# Patient Record
Sex: Female | Born: 1955 | Race: White | Hispanic: No | Marital: Married | Smoking: Former smoker
Health system: Southern US, Community
[De-identification: ages and names within clinical notes are randomized; demographics above are authoritative.]

## PROBLEM LIST (undated history)

## (undated) DIAGNOSIS — E669 Obesity, unspecified: Secondary | ICD-10-CM

## (undated) DIAGNOSIS — E119 Type 2 diabetes mellitus without complications: Secondary | ICD-10-CM

---

## 2004-09-09 ENCOUNTER — Ambulatory Visit: Payer: Self-pay

## 2006-03-15 ENCOUNTER — Ambulatory Visit: Payer: Self-pay | Admitting: Internal Medicine

## 2006-04-07 ENCOUNTER — Ambulatory Visit: Payer: Self-pay | Admitting: Internal Medicine

## 2006-10-03 ENCOUNTER — Ambulatory Visit: Payer: Self-pay | Admitting: Internal Medicine

## 2007-04-12 ENCOUNTER — Ambulatory Visit: Payer: Self-pay | Admitting: Internal Medicine

## 2007-05-29 ENCOUNTER — Ambulatory Visit: Payer: Self-pay | Admitting: Internal Medicine

## 2007-06-11 IMAGING — US ULTRASOUND LEFT BREAST
1 series · 17 of 22 positions shown · non-contrast
Comparison: none

REASON FOR EXAM: Nodule
COMMENTS:

[Series 1: ultrasound left breast · 17 of 22 slices shown]
[im 1/22]
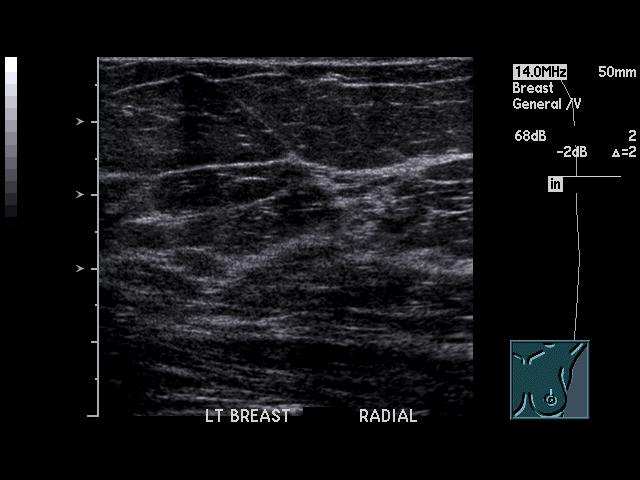
[im 2/22]
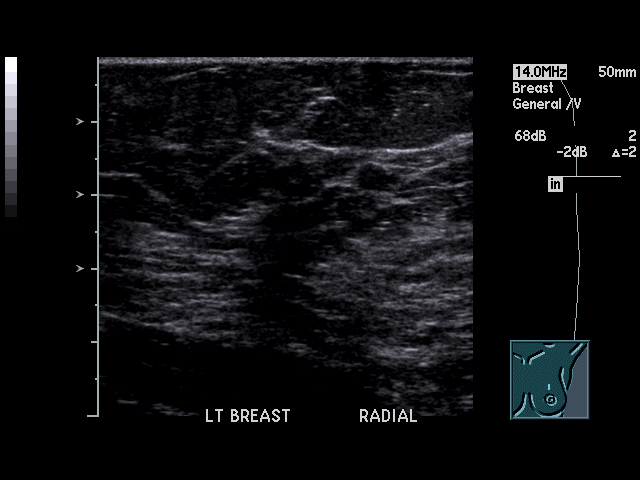
[im 4/22]
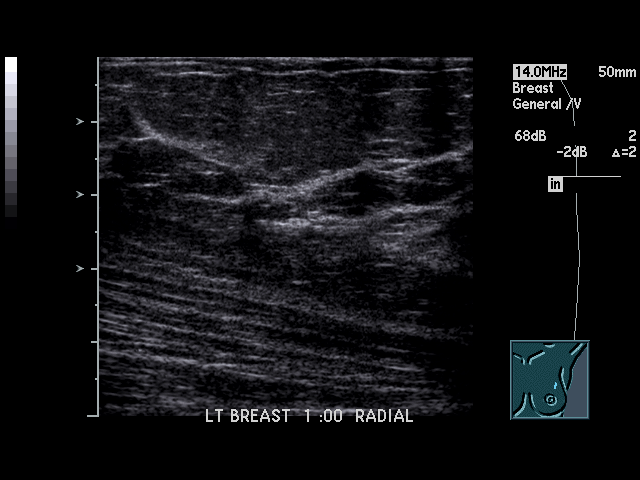
[im 5/22]
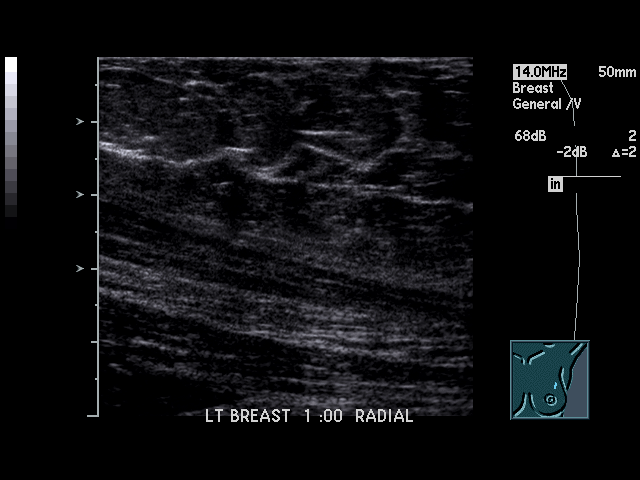
[im 6/22]
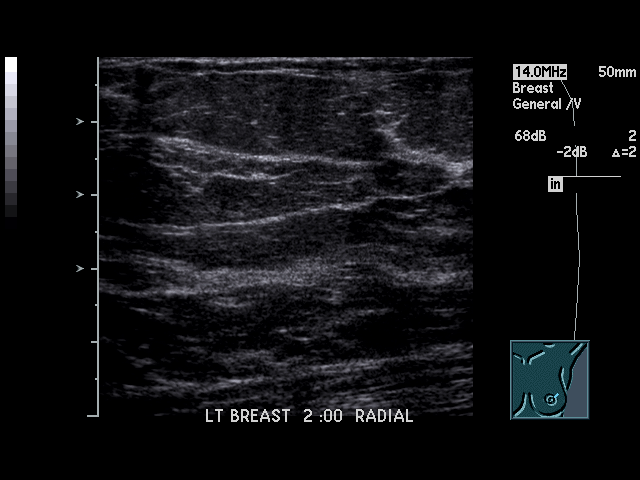
[im 8/22]
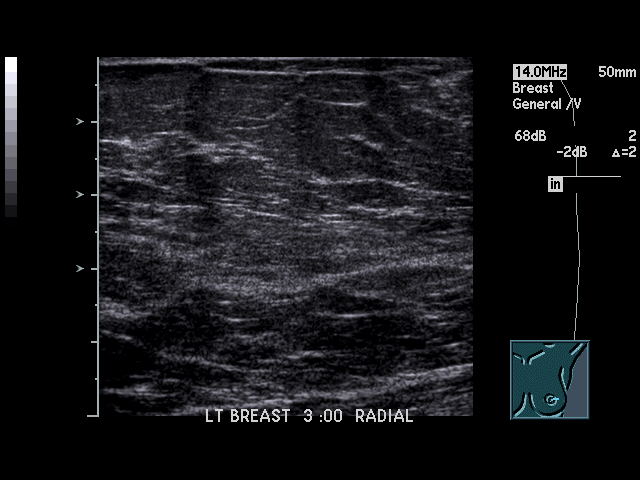
[im 9/22]
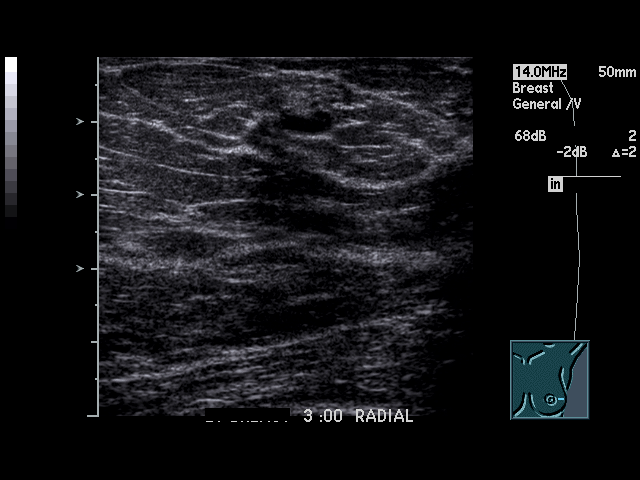
[im 10/22]
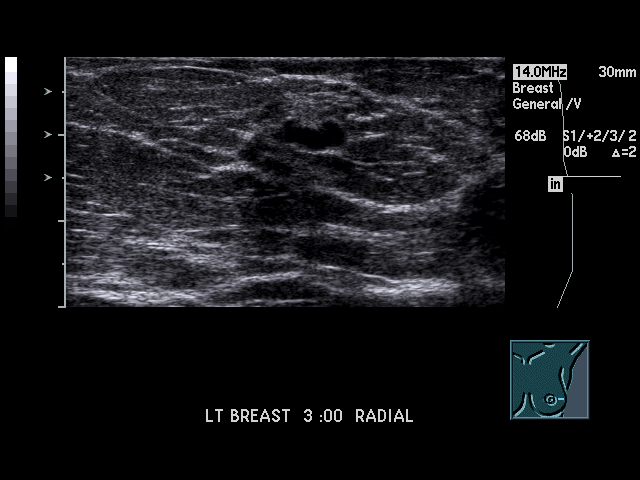
[im 12/22]
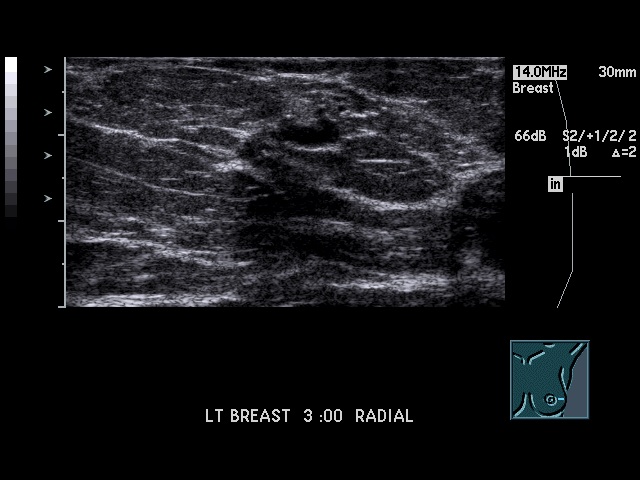
[im 13/22]
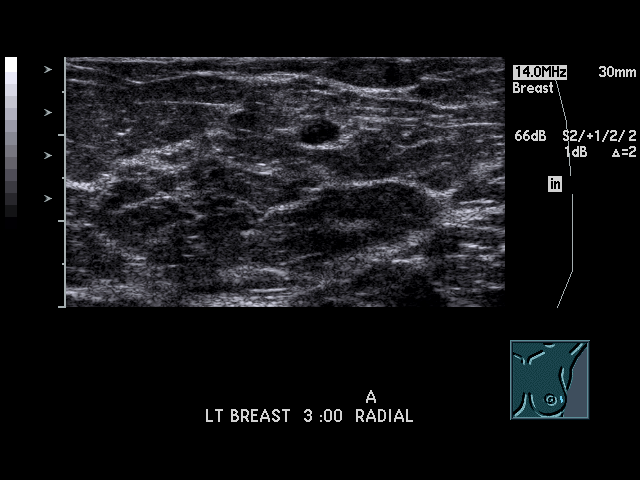
[im 14/22]
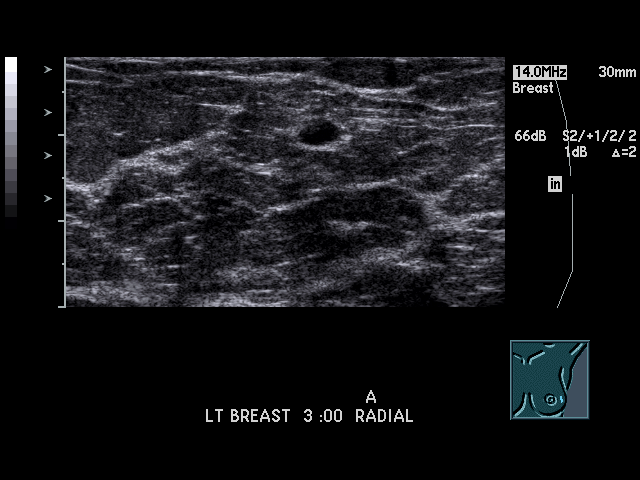
[im 15/22]
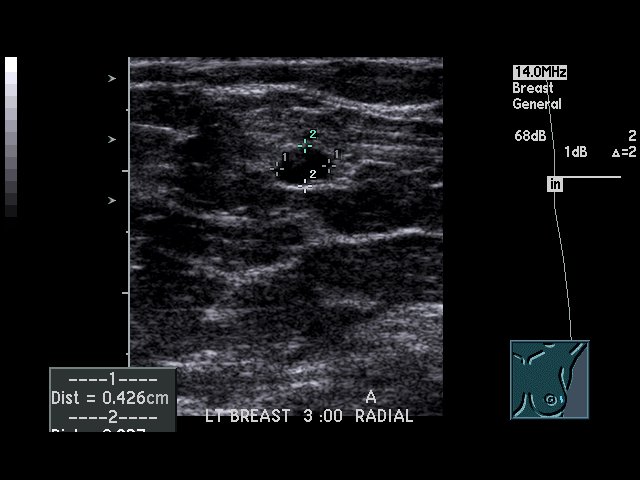
[im 17/22]
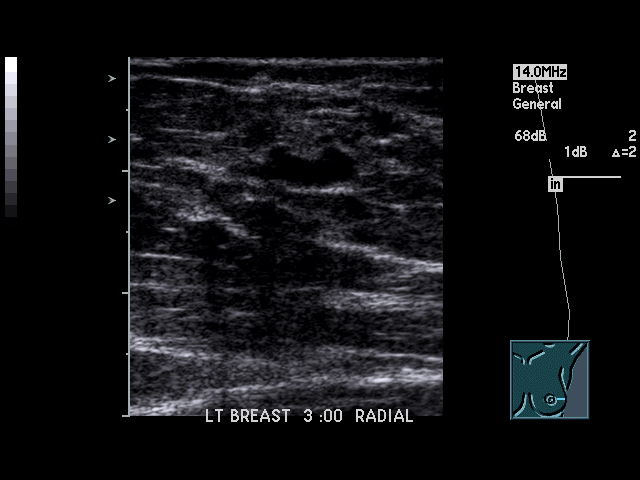
[im 18/22]
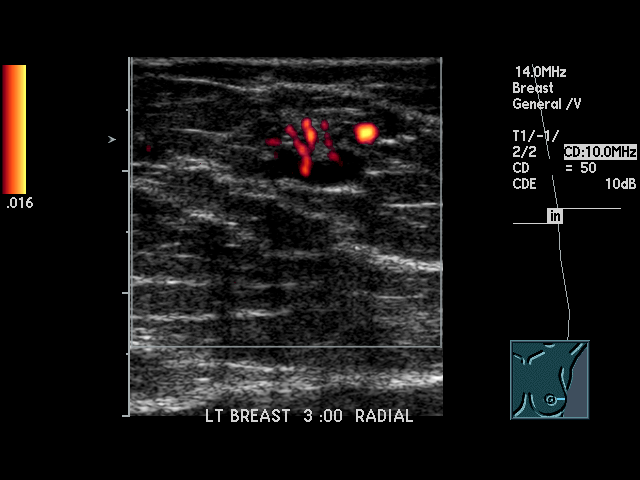
[im 19/22]
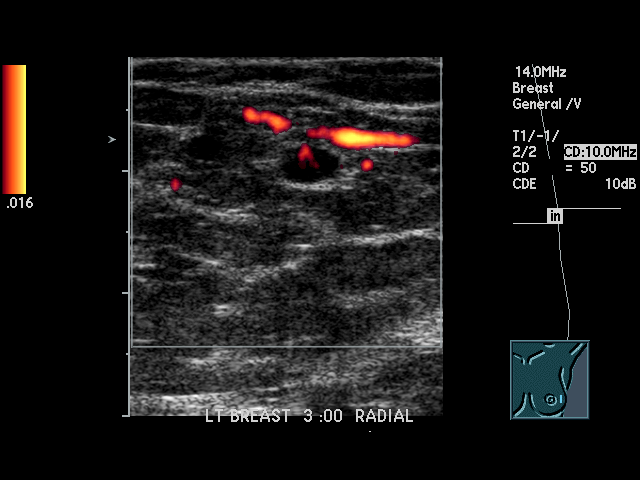
[im 21/22]
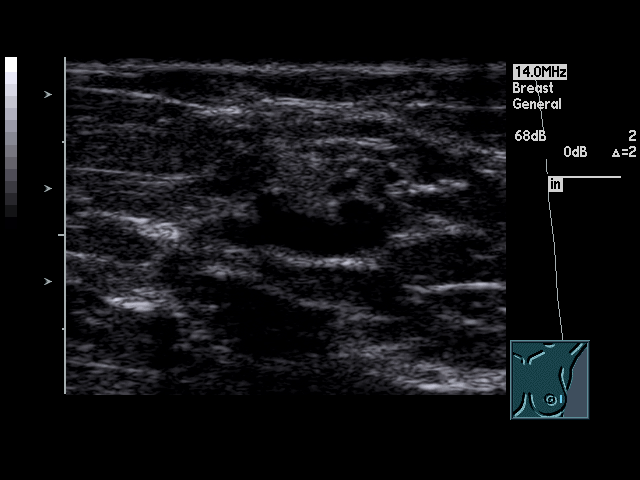
[im 22/22]
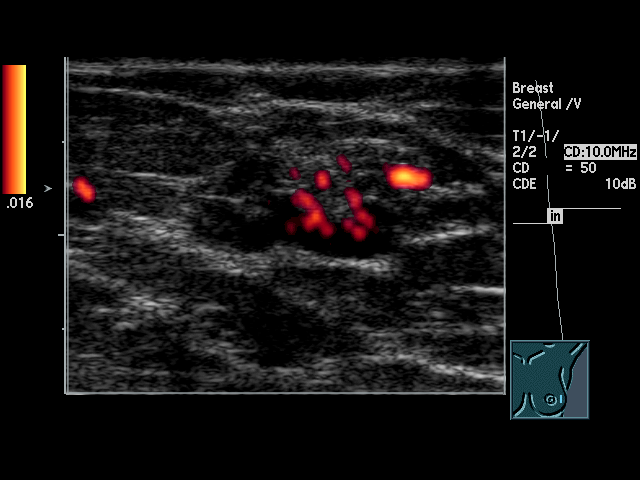

[17 of 22 positions shown; findings below may reference images not displayed]

PROCEDURE:     US  - US BREAST LEFT  - April 07, 2006  [DATE]

RESULT:     A reniform, hypoechoic nodule with no through transmission but
increased echogenicity in the central portion of the nodule is noted in the
upper outer aspect of the LEFT breast measuring approximately 6.0 to 7.0 mm.
This is consistent with a benign, intramammary lymph node. Follow-up LEFT
breast mammogram to demonstrate stability is suggested in six months.
IMPRESSION: Findings most consistent with a benign, intramammary lymph
node as described above.

BI-RADS: Category 3 - Probably Benign Finding (Interval follow-up).

## 2008-06-17 ENCOUNTER — Ambulatory Visit: Payer: Self-pay | Admitting: Internal Medicine

## 2008-07-10 ENCOUNTER — Ambulatory Visit: Payer: Self-pay | Admitting: Internal Medicine

## 2009-01-10 ENCOUNTER — Ambulatory Visit: Payer: Self-pay

## 2009-08-27 ENCOUNTER — Ambulatory Visit: Payer: Self-pay | Admitting: Internal Medicine

## 2010-09-22 ENCOUNTER — Ambulatory Visit: Payer: Self-pay

## 2018-03-12 ENCOUNTER — Emergency Department: Payer: 59

## 2018-03-12 ENCOUNTER — Emergency Department
Admission: EM | Admit: 2018-03-12 | Discharge: 2018-03-12 | Disposition: A | Discharge status: Home / Self Care | Payer: 59 | Attending: Emergency Medicine | Admitting: Emergency Medicine

## 2018-03-12 ENCOUNTER — Other Ambulatory Visit: Payer: Self-pay

## 2018-03-12 DIAGNOSIS — E119 Type 2 diabetes mellitus without complications: Secondary | ICD-10-CM | POA: Insufficient documentation

## 2018-03-12 DIAGNOSIS — H5319 Other subjective visual disturbances: Secondary | ICD-10-CM | POA: Diagnosis not present

## 2018-03-12 DIAGNOSIS — R51 Headache: Secondary | ICD-10-CM | POA: Diagnosis present

## 2018-03-12 DIAGNOSIS — Z87891 Personal history of nicotine dependence: Secondary | ICD-10-CM | POA: Diagnosis not present

## 2018-03-12 HISTORY — DX: Type 2 diabetes mellitus without complications: E11.9

## 2018-03-12 HISTORY — DX: Obesity, unspecified: E66.9

## 2018-03-12 LAB — CBC
HCT: 43.6 % (ref 36.0–46.0)
Hemoglobin: 13.6 g/dL (ref 12.0–15.0)
MCH: 28.9 pg (ref 26.0–34.0)
MCHC: 31.2 g/dL (ref 30.0–36.0)
MCV: 92.6 fL (ref 80.0–100.0)
Platelets: 311 10*3/uL (ref 150–400)
RBC: 4.71 MIL/uL (ref 3.87–5.11)
RDW: 12.8 % (ref 11.5–15.5)
WBC: 6.9 10*3/uL (ref 4.0–10.5)
nRBC: 0 % (ref 0.0–0.2)

## 2018-03-12 LAB — BASIC METABOLIC PANEL
Anion gap: 8 (ref 5–15)
BUN: 18 mg/dL (ref 8–23)
CO2: 27 mmol/L (ref 22–32)
Calcium: 9.2 mg/dL (ref 8.9–10.3)
Chloride: 106 mmol/L (ref 98–111)
Creatinine, Ser: 0.78 mg/dL (ref 0.44–1.00)
GFR calc non Af Amer: >60 mL/min (ref >60)
Glucose, Bld: 121 mg/dL — ABNORMAL HIGH (ref 70–99)
Potassium: 4.1 mmol/L (ref 3.5–5.1)
Sodium: 141 mmol/L (ref 135–145)

## 2018-03-12 NOTE — ED Notes (Signed)
Signature pad not working unable to obtain signature for discharge. Hard copy printed and signed by pt.

## 2018-03-12 NOTE — ED Triage Notes (Signed)
Pt states yesterday starting "seeing lightning bolts off to the side" on R side. States HA and pressure in beck of neck. Symptoms began "right after I woke up but it's not continuous." took 2 excedrin. Denies blood thinner use.   A&O, ambulatory. Speaking in clear complete sentences. Moving all extremities. No drift/droop noted.

## 2018-03-12 NOTE — Discharge Instructions (Signed)
Your labs and CT scan of the head today were unremarkable.  Please follow-up with ophthalmology for further assessment of your visual symptoms.

## 2018-03-12 NOTE — ED Notes (Signed)
Pt verbalized understanding of discharge instructions. NAD at this time. 

## 2018-03-12 NOTE — ED Provider Notes (Signed)
[facility] Emergency Department Provider Note  ____________________________________________  Time seen: Approximately 5:51 PM  I have reviewed the triage vital signs and the nursing notes.   HISTORY  Chief Complaint Headache and Eye Problem    HPI Morgan Huynh is a 62 y.o. female who reports yesterday morning having intermittent episodes of seeing a flash of light off in her right peripheral vision.  No specific inciting activities such as looking in one direction or turning her neck.  No thunderclap headache or sudden severe pain in the head or neck.  Symptoms have been intermittent and have been completely resolved for the past 6 hours.  Currently feels back to normal.  She also reports some right occipital pain and right neck pain which is typical of her usual migraine syndrome.  Overall she does feel like her symptoms are consistent with her usual migraines, other than the flash of light in her peripheral vision.  Denies any extremity paresthesia or weakness, no recent trauma.      Past Medical History:  Diagnosis Date  . Diabetes mellitus without complication (HCC)   . Obesity      There are no active problems to display for this patient.    History reviewed. No pertinent surgical history.   Prior to Admission medications   Not on File     Allergies Patient has no known allergies.   History reviewed. No pertinent family history.  Social History Social History   Tobacco Use  . Smoking status: Former Smoker  Substance Use Topics  . Alcohol use: Yes  . Drug use: Not on file    Review of Systems  Constitutional:   No fever or chills.  ENT:   No sore throat. No rhinorrhea. Cardiovascular:   No chest pain or syncope. Respiratory:   No dyspnea or cough. Gastrointestinal:   Negative for abdominal pain, vomiting and diarrhea.  Musculoskeletal:   Negative for focal pain or swelling All other systems reviewed and are negative except  as documented above in ROS and HPI.  ____________________________________________   PHYSICAL EXAM:  VITAL SIGNS: ED Triage Vitals  Enc Vitals Group     BP 03/12/18 1347 (!) 99/58     Pulse Rate 03/12/18 1347 69     Resp 03/12/18 1347 18     Temp 03/12/18 1347 98.2 F (36.8 C)     Temp Source 03/12/18 1347 Oral     SpO2 03/12/18 1347 96 %     Weight 03/12/18 1344 250 lb (113.4 kg)     Height 03/12/18 1344 5\' 7"  (1.702 m)     Head Circumference --      Peak Flow --      Pain Score 03/12/18 1343 6     Pain Loc --      Pain Edu? --      Excl. in GC? --     Vital signs reviewed, nursing assessments reviewed.   Constitutional:   Alert and oriented. Non-toxic appearance. Eyes:   Conjunctivae are normal. EOMI. PERRL.  No nystagmus ENT      Head:   Normocephalic and atraumatic.      Nose:   No congestion/rhinnorhea.       Mouth/Throat:   MMM, no pharyngeal erythema. No peritonsillar mass.       Neck:   No meningismus. Full ROM. Hematological/Lymphatic/Immunilogical:   No cervical lymphadenopathy. Cardiovascular:   RRR. Symmetric bilateral radial and DP pulses.  No murmurs. Cap refill less than 2 seconds.  Respiratory:   Normal respiratory effort without tachypnea/retractions. Breath sounds are clear and equal bilaterally. No wheezes/rales/rhonchi. Gastrointestinal:   Soft and nontender. Non distended. There is no CVA tenderness.  No rebound, rigidity, or guarding. Genitourinary:   deferred Musculoskeletal:   Normal range of motion in all extremities. No joint effusions.  No lower extremity tenderness.  No edema. Neurologic:   Normal speech and language.  Cranial nerves II through XII intact Normal finger-to-nose, no pronator drift Motor grossly intact. NIH stroke scale 0 No acute focal neurologic deficits are appreciated.  Skin:    Skin is warm, dry and intact. No rash noted.  No petechiae, purpura, or bullae.  ____________________________________________    LABS  (pertinent positives/negatives) (all labs ordered are listed, but only abnormal results are displayed) Labs Reviewed  BASIC METABOLIC PANEL - Abnormal; Notable for the following components:      Result Value   Glucose, Bld 121 (*)    All other components within normal limits  CBC   ____________________________________________   EKG    ____________________________________________    RADIOLOGY  Ct Head Wo Contrast  Result Date: 03/12/2018 CLINICAL DATA:  Headache, neck pressure, visual changes EXAM: CT HEAD WITHOUT CONTRAST TECHNIQUE: Contiguous axial images were obtained from the base of the skull through the vertex without intravenous contrast. COMPARISON:  None. FINDINGS: Brain: No evidence of acute infarction, hemorrhage, hydrocephalus, extra-axial collection or mass lesion/mass effect. Vascular: No hyperdense vessel or unexpected calcification. Skull: Normal. Negative for fracture or focal lesion. Sinuses/Orbits: The visualized paranasal sinuses are essentially clear. The mastoid air cells are unopacified. Other: None. IMPRESSION: Normal head CT. Electronically Signed   By: Charline Bills M.D.   On: 03/12/2018 14:36    ____________________________________________   PROCEDURES Procedures  ____________________________________________    CLINICAL IMPRESSION / ASSESSMENT AND PLAN / ED COURSE  Pertinent labs & imaging results that were available during my care of the patient were reviewed by me and considered in my medical decision making (see chart for details).    Presents with photopsia of right eye consisting of flasher.  No vision loss.  Possible retinal pathology.  Recommend follow-up with ophthalmology tomorrow.  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  No other acute issues.Considering the patient's symptoms, medical history, and physical examination today, I have low suspicion for ischemic  stroke, intracranial hemorrhage, meningitis, encephalitis, carotid or vertebral dissection, venous sinus thrombosis, MS, intracranial hypertension, glaucoma, CRAO, CRVO, or temporal arteritis.       ____________________________________________   FINAL CLINICAL IMPRESSION(S) / ED DIAGNOSES    Final diagnoses:  Photopsia of right eye     ED Discharge Orders    None      Portions of this note were generated with dragon dictation software. Dictation errors may occur despite best attempts at proofreading.    Sharman Cheek, MD 03/12/18 [phone]

## 2019-04-11 ENCOUNTER — Ambulatory Visit: Payer: Medicare HMO | Attending: Internal Medicine

## 2019-04-11 DIAGNOSIS — Z20822 Contact with and (suspected) exposure to covid-19: Secondary | ICD-10-CM

## 2019-04-12 LAB — NOVEL CORONAVIRUS, NAA: SARS-CoV-2, NAA: NOT DETECTED

## 2019-04-27 ENCOUNTER — Ambulatory Visit: Payer: Medicare HMO | Attending: Internal Medicine

## 2019-04-27 DIAGNOSIS — Z23 Encounter for immunization: Secondary | ICD-10-CM

## 2019-04-27 NOTE — Progress Notes (Signed)
   Covid-19 Vaccination Clinic  Name:  Morgan Huynh    MRN: [id number] DOB: 09-29-1955  04/27/2019  [name] was observed post Covid-19 immunization for 15 minutes without incidence. She was provided with Vaccine Information Sheet and instruction to access the V-Safe system.   [name] was instructed to call 911 with any severe reactions post vaccine: Marland Kitchen Difficulty breathing  . Swelling of your face and throat  . A fast heartbeat  . A bad rash all over your body  . Dizziness and weakness    Immunizations Administered    Name Date Dose VIS Date Route   Pfizer COVID-19 Vaccine 04/27/2019  6:26 PM 0.3 mL 03/16/2019 Intramuscular   Manufacturer: [employer], [employer]   Lot: [id number]   NDC: [address]-2

## 2019-05-18 ENCOUNTER — Ambulatory Visit: Payer: Medicare HMO | Attending: Internal Medicine

## 2019-05-18 DIAGNOSIS — Z23 Encounter for immunization: Secondary | ICD-10-CM | POA: Insufficient documentation

## 2019-05-18 NOTE — Progress Notes (Signed)
   Covid-19 Vaccination Clinic  Name:  Morgan Huynh    MRN: [id number] DOB: [birth year]  05/18/2019  [name] was observed post Covid-19 immunization for 15 minutes without incidence. She was provided with Vaccine Information Sheet and instruction to access the V-Safe system.   [name] was instructed to call 911 with any severe reactions post vaccine: Marland Kitchen Difficulty breathing  . Swelling of your face and throat  . A fast heartbeat  . A bad rash all over your body  . Dizziness and weakness    Immunizations Administered    Name Date Dose VIS Date Route   Pfizer COVID-19 Vaccine 05/18/2019  5:22 PM 0.3 mL 03/16/2019 Intramuscular   Manufacturer: [employer], [employer]   Lot: [id number]   NDC: [address]-4
